# Patient Record
Sex: Male | Born: 1995 | Race: White | Hispanic: No | Marital: Single | Smoking: Never smoker
Health system: Southern US, Community
[De-identification: ages and names within clinical notes are randomized; demographics above are authoritative.]

## PROBLEM LIST (undated history)

## (undated) HISTORY — PX: NASAL SINUS SURGERY: SHX719

---

## 2019-03-02 ENCOUNTER — Encounter: Payer: Self-pay | Admitting: Emergency Medicine

## 2019-03-02 ENCOUNTER — Other Ambulatory Visit: Payer: Self-pay

## 2019-03-02 ENCOUNTER — Ambulatory Visit
Admission: EM | Admit: 2019-03-02 | Discharge: 2019-03-02 | Disposition: A | Discharge status: Home / Self Care | Payer: Self-pay | Attending: Family Medicine | Admitting: Family Medicine

## 2019-03-02 DIAGNOSIS — J029 Acute pharyngitis, unspecified: Secondary | ICD-10-CM

## 2019-03-02 DIAGNOSIS — J069 Acute upper respiratory infection, unspecified: Secondary | ICD-10-CM

## 2019-03-02 LAB — RAPID STREP SCREEN (MED CTR MEBANE ONLY): Streptococcus, Group A Screen (Direct): NEGATIVE

## 2019-03-02 NOTE — Discharge Instructions (Signed)
It was very nice seeing you today in clinic. Thank you for entrusting me with your care.   Rest and Stay HYDRATED. Water and electrolyte containing beverages (Gatorade, Pedialyte) are best to prevent dehydration and electrolyte abnormalities. Warm salt water gargles and throat lozenges will help soothe your throat. May use Tylenol and/or Ibuprofen as needed for pain/fever.   You declined COVID testing today. If symptoms worsen, or if you change you mind about testing, you can return here, or go to the drive-thru testing site at [facility].   Make arrangements to follow up with your regular doctor in 1 week for re-evaluation if not improving. If your symptoms/condition worsens, please seek follow up care either here or in the ER. Please remember, our [place] providers are "right here with you" when you need [place].   Again, it was my pleasure to take care of you today. Thank you for choosing our clinic. I hope that you start to feel better quickly.   Honor Loh, MSN, APRN, FNP-C, CEN Advanced Practice Provider [facility]

## 2019-03-02 NOTE — ED Provider Notes (Signed)
[place], [place]   Name: Christian Adams DOB: Apr 19, 1995 MRN: [id number] CSN: [id number] PCP: Patient, No Pcp Per  Arrival date and time:  03/02/19 1416  Chief Complaint:  Sore Throat   NOTE: Prior to seeing the patient today, I have reviewed the triage nursing documentation and vital signs. Clinical staff has updated patient's PMH/PSHx, current medication list, and drug allergies/intolerances to ensure comprehensive history available to assist in medical decision making.   History:   HPI: Christian Adams is a 23 y.o. male who presents today with complaints of sore throat and sinus congestion that began 2 days ago. He advises that his throat doesn't really hurt as much as is there is a "discomfort", which is worse in the morning because he tends to sleep with his mouth open causing his mouth to be dry. Pain rated 1/10. Patient states, "I am almost sure that I have strep throat though". Patient denies fevers, cough, and shortness of breath. He attributes his congestion to his normal seasonal allergies for which he takes fexofenadine.  He denies that he has experienced any nausea, vomiting, diarrhea, or abdominal pain. He is eating and drinking well. Patient denies any perceived alterations to his sense of taste or smell. Patient denies being in close contact with anyone known to be ill; no one else is his home has experienced a similar constellation. He has never been tested for SARS-CoV-2 (novel coronavirus) in the past per his report and is refusing testing today. Patient states, "I think that I had COVID earlier in the year. It kicked my butt. I know that is what I had". Patient has not been vaccinated for influenza as of yet this season. Despite his symptoms, patient has not taken any over the counter interventions to help improve/relieve his reported symptoms at home.    History reviewed. No pertinent past medical history.  Past Surgical History:  Procedure Laterality Date  . NASAL SINUS SURGERY       Family History  Problem Relation Age of Onset  . Healthy Mother   . Healthy Father     Social History   Tobacco Use  . Smoking status: Never Smoker  . Smokeless tobacco: Never Used  Substance Use Topics  . Alcohol use: Yes  . Drug use: Not Currently    There are no active problems to display for this patient.   Home Medications:    Current Meds  Medication Sig  . fexofenadine (ALLEGRA) 180 MG tablet Take 180 mg by mouth daily.    Allergies:   Patient has no known allergies.  Review of Systems (ROS): Review of Systems  Constitutional: Negative for fatigue and fever.  HENT: Positive for congestion, postnasal drip and sore throat. Negative for ear pain, rhinorrhea, sinus pressure, sinus pain and sneezing.   Eyes: Negative for pain, discharge and redness.  Respiratory: Negative for cough, chest tightness and shortness of breath.   Cardiovascular: Negative for chest pain and palpitations.  Gastrointestinal: Negative for abdominal pain, diarrhea, nausea and vomiting.  Musculoskeletal: Negative for arthralgias, back pain, myalgias and neck pain.  Skin: Negative for color change, pallor and rash.  Allergic/Immunologic: Positive for environmental allergies (seasonal).  Neurological: Negative for dizziness, syncope, weakness and headaches.  Hematological: Negative for adenopathy.     Vital Signs: Today's Vitals   03/02/19 1441 03/02/19 1442 03/02/19 1446 03/02/19 1517  BP:   111/89   Pulse:   69   Resp:   18   Temp:   97.8 F (36.6 C)  TempSrc:   Oral   SpO2:   98%   Weight:  214 lb (97.1 kg)    Height:  6' (1.829 m)    PainSc: 1    1     Physical Exam: Physical Exam  Constitutional: He is oriented to person, place, and time and well-developed, well-nourished, and in no distress.  HENT:  Head: Normocephalic and atraumatic.  Right Ear: Tympanic membrane normal.  Left Ear: Tympanic membrane normal.  Nose: Mucosal edema and rhinorrhea present. No sinus  tenderness.  Mouth/Throat: Uvula is midline and mucous membranes are normal. Posterior oropharyngeal erythema (+) white (purulent) PND present. No oropharyngeal exudate or posterior oropharyngeal edema.  Eyes: Pupils are equal, round, and reactive to light.  Neck: Normal range of motion. Neck supple.  Cardiovascular: Normal rate, regular rhythm, normal heart sounds and intact distal pulses.  Pulmonary/Chest: Effort normal and breath sounds normal.  Musculoskeletal: Normal range of motion.  Neurological: He is alert and oriented to person, place, and time. Gait normal.  Skin: Skin is warm and dry. No rash noted. He is not diaphoretic.  Psychiatric: Mood, memory, affect and judgment normal.  Nursing note and vitals reviewed.   Urgent Care Treatments / Results:  LABS: PLEASE NOTE: all labs that were ordered this encounter are listed, however only abnormal results are displayed. Labs Reviewed  RAPID STREP SCREEN ([facility] ONLY)  CULTURE, GROUP A STREP [facility])    EKG: -None  RADIOLOGY: No results found.  PROCEDURES: Procedures  MEDICATIONS RECEIVED THIS VISIT: Medications - No data to display  PERTINENT CLINICAL COURSE NOTES/UPDATES:   Initial Impression / Assessment and Plan / Urgent Care Course:  Pertinent labs & imaging results that were available during my care of the patient were personally reviewed by me and considered in my medical decision making (see lab/imaging section of note for values and interpretations).  Christian Adams is a 23 y.o. male who presents to [facility] today with complaints of Sore Throat   Patient overall well appearing and in no acute distress today in clinic. Presenting symptoms (see HPI) and exam as documented above. He presents with symptoms associated with SARS-CoV-2 (novel coronavirus), however he refuses the testing today citing that he feels like he had the viral infection earlier in the year. Exam reveals minor throat irritation with  evidence of thick white PND. Rapid streptococcal throat swab (-); reflex culture sent. Symptoms consistent with acute viral URI. I discussed with him that his symptoms are felt to be viral in nature, thus antibiotics would not offer him any relief or improve his symptoms any faster than conservative symptomatic management. Discussed supportive care measures at home during acute phase of illness. Patient to rest as much as possible. He was encouraged to ensure adequate hydration (water and ORS) to prevent dehydration and electrolyte derangements. Patient may use APAP and/or IBU on an as needed basis for pain/fever. Encouraged to use warm salt water gargles, hot teas, and lozenges to help soothe his throat discomfort.   Discussed follow up with primary care physician in 1 week for re-evaluation. I have reviewed the follow up and strict return precautions for any new or worsening symptoms. Patient is aware of symptoms that would be deemed urgent/emergent, and would thus require further evaluation either here or in the emergency department. At the time of discharge, he verbalized understanding and consent with the discharge plan as it was reviewed with him. All questions were fielded by provider and/or clinic staff prior to patient  discharge.    Final Clinical Impressions / Urgent Care Diagnoses:   Final diagnoses:  Viral URI    New Prescriptions:  [place] Controlled Substance Registry consulted? Not Applicable  No orders of the defined types were placed in this encounter.   Recommended Follow up Care:  Patient encouraged to follow up with the following provider within the specified time frame, or sooner as dictated by the severity of his symptoms. As always, he was instructed that for any urgent/emergent care needs, he should seek care either here or in the emergency department for more immediate evaluation.  Follow-up Information    PCP In 1 week.   Why: General reassessment of symptoms if not  improving        NOTE: This note was prepared using Scientist, clinical (histocompatibility and immunogenetics)Dragon dictation software along with smaller Lobbyistphrase technology. Despite my best ability to proofread, there is the potential that transcriptional errors may still occur from this process, and are completely unintentional.    Verlee MonteGray, Wilfred Dayrit E, NP 03/03/19 0022

## 2019-03-02 NOTE — ED Triage Notes (Signed)
Pt c/o sore throat. Started about 2 days ago. Denies fever, headache, nausea. He later states that he has sinus congestion and "typical allergies" pt is self pay and declines covid testing until strep test comes back.

## 2019-03-05 LAB — CULTURE, GROUP A STREP (THRC)

## 2019-05-01 ENCOUNTER — Other Ambulatory Visit: Payer: Self-pay

## 2019-05-01 ENCOUNTER — Emergency Department: Payer: BC Managed Care – PPO

## 2019-05-01 DIAGNOSIS — R0789 Other chest pain: Secondary | ICD-10-CM | POA: Diagnosis present

## 2019-05-01 LAB — CBC
HCT: 44.7 % (ref 39.0–52.0)
Hemoglobin: 15.1 g/dL (ref 13.0–17.0)
MCH: 29.2 pg (ref 26.0–34.0)
MCHC: 33.8 g/dL (ref 30.0–36.0)
MCV: 86.3 fL (ref 80.0–100.0)
Platelets: 375 10*3/uL (ref 150–400)
RBC: 5.18 MIL/uL (ref 4.22–5.81)
RDW: 12.3 % (ref 11.5–15.5)
WBC: 11 10*3/uL — ABNORMAL HIGH (ref 4.0–10.5)
nRBC: 0 % (ref 0.0–0.2)

## 2019-05-01 NOTE — ED Triage Notes (Signed)
Patient reports left sided chest pain that started around 5 pm worse with movement.

## 2019-05-02 ENCOUNTER — Emergency Department
Admission: EM | Admit: 2019-05-02 | Discharge: 2019-05-02 | Disposition: A | Discharge status: Home / Self Care | Payer: BC Managed Care – PPO | Attending: Student in an Organized Health Care Education/Training Program | Admitting: Student in an Organized Health Care Education/Training Program

## 2019-05-02 DIAGNOSIS — R0789 Other chest pain: Secondary | ICD-10-CM

## 2019-05-02 LAB — BASIC METABOLIC PANEL
Anion gap: 8 (ref 5–15)
BUN: 14 mg/dL (ref 6–20)
CO2: 27 mmol/L (ref 22–32)
Calcium: 9.3 mg/dL (ref 8.9–10.3)
Chloride: 104 mmol/L (ref 98–111)
Creatinine, Ser: 1.13 mg/dL (ref 0.61–1.24)
GFR calc Af Amer: >60 mL/min (ref >60)
GFR calc non Af Amer: >60 mL/min (ref >60)
Glucose, Bld: 98 mg/dL (ref 70–99)
Potassium: 4 mmol/L (ref 3.5–5.1)
Sodium: 139 mmol/L (ref 135–145)

## 2019-05-02 LAB — TROPONIN I (HIGH SENSITIVITY): Troponin I (High Sensitivity): <2 ng/L (ref <18)

## 2019-05-02 NOTE — ED Provider Notes (Signed)
[facility] Emergency Department Provider Note    First MD Initiated Contact with Patient 05/02/19 0114     (approximate)  I have reviewed the triage vital signs and the nursing notes.   HISTORY  Chief Complaint Chest Pain    HPI Christian Adams is a 24 y.o. male previously healthy presents to the ER for chest discomfort.  This discomfort started while he was at work.  Felt like he was under increased stress at work.  States has had similar symptoms over the past 3 years.  Feels like he can feel his heart thumping and has some pain radiating up into his jaw.  Denies any shortness of breath diaphoresis pain radiating through to his back.  Symptoms last up to an over an hour.  Denies any pain with deep inspiration right now.  Does not smoke.  Drinks occasionally.  States he also sometimes feels an irregular heartbeat.  Has not sought medical care for this yet.    No past medical history on file. Family History  Problem Relation Age of Onset  . Healthy Mother   . Healthy Father    Past Surgical History:  Procedure Laterality Date  . NASAL SINUS SURGERY     There are no problems to display for this patient.     Prior to Admission medications   Medication Sig Start Date End Date Taking? Authorizing Provider  fexofenadine (ALLEGRA) 180 MG tablet Take 180 mg by mouth daily.    [provider]    Allergies Patient has no known allergies.    Social History Social History   Tobacco Use  . Smoking status: Never Smoker  . Smokeless tobacco: Never Used  Substance Use Topics  . Alcohol use: Yes  . Drug use: Not Currently    Review of Systems Patient denies headaches, rhinorrhea, blurry vision, numbness, shortness of breath, chest pain, edema, cough, abdominal pain, nausea, vomiting, diarrhea, dysuria, fevers, rashes or hallucinations unless otherwise stated above in HPI. ____________________________________________   PHYSICAL  EXAM:  VITAL SIGNS: Vitals:   05/01/19 2344 05/02/19 0133  BP: 139/72 (!) 144/74  Pulse: 63 60  Resp: 16 16  Temp: 98.6 F (37 C)   SpO2: 100% 100%    Constitutional: Alert and oriented.  Eyes: Conjunctivae are normal.  Head: Atraumatic. Nose: No congestion/rhinnorhea. Mouth/Throat: Mucous membranes are moist.   Neck: No stridor. Painless ROM.  Cardiovascular: Normal rate, regular rhythm. Grossly normal heart sounds.  Good peripheral circulation. Respiratory: Normal respiratory effort.  No retractions. Lungs CTAB. Gastrointestinal: Soft and nontender. No distention. No abdominal bruits. No CVA tenderness. Genitourinary:  Musculoskeletal: No lower extremity tenderness nor edema.  No joint effusions. Neurologic:  Normal speech and language. No gross focal neurologic deficits are appreciated. No facial droop Skin:  Skin is warm, dry and intact. No rash noted. Psychiatric: Mood and affect are normal. Speech and behavior are normal.  ____________________________________________   LABS (all labs ordered are listed, but only abnormal results are displayed)  Results for orders placed or performed during the hospital encounter of 05/02/19 (from the past 24 hour(s))  Basic metabolic panel     Status: None   Collection Time: 05/01/19 11:48 PM  Result Value Ref Range   Sodium 139 135 - 145 mmol/L   Potassium 4.0 3.5 - 5.1 mmol/L   Chloride 104 98 - 111 mmol/L   CO2 27 22 - 32 mmol/L   Glucose, Bld 98 70 - 99 mg/dL   BUN  14 6 - 20 mg/dL   Creatinine, Ser 1.13 0.61 - 1.24 mg/dL   Calcium 9.3 8.9 - 10.3 mg/dL   GFR calc non Af Amer >60 >60 mL/min   GFR calc Af Amer >60 >60 mL/min   Anion gap 8 5 - 15  CBC     Status: Abnormal   Collection Time: 05/01/19 11:48 PM  Result Value Ref Range   WBC 11.0 (H) 4.0 - 10.5 K/uL   RBC 5.18 4.22 - 5.81 MIL/uL   Hemoglobin 15.1 13.0 - 17.0 g/dL   HCT 44.7 39.0 - 52.0 %   MCV 86.3 80.0 - 100.0 fL   MCH 29.2 26.0 - 34.0 pg   MCHC 33.8 30.0 -  36.0 g/dL   RDW 12.3 11.5 - 15.5 %   Platelets 375 150 - 400 K/uL   nRBC 0.0 0.0 - 0.2 %  Troponin I (High Sensitivity)     Status: None   Collection Time: 05/01/19 11:48 PM  Result Value Ref Range   Troponin I (High Sensitivity) <2 <18 ng/L   ____________________________________________  EKG My review and personal interpretation at Time: 23:42   Indication: chest pain  Rate: 60  Rhythm: sinus Axis: normal Other: normal intervals, norm st and t wave segments ____________________________________________  RADIOLOGY  I personally reviewed all radiographic images ordered to evaluate for the above acute complaints and reviewed radiology reports and findings.  These findings were personally discussed with the patient.  Please see medical record for radiology report.  ____________________________________________   PROCEDURES  Procedure(s) performed:  Procedures    Critical Care performed: no ____________________________________________   INITIAL IMPRESSION / ASSESSMENT AND PLAN / ED COURSE  Pertinent labs & imaging results that were available during my care of the patient were reviewed by me and considered in my medical decision making (see chart for details).   DDX: ACS, pericarditis, esophagitis, boerhaaves, pe, dissection, pna, bronchitis, costochondritis   Christian Adams is a 24 y.o. who presents to the ED with symptoms as described above.  Patient well-appearing and in no acute distress.  Is low risk by Wells criteria and PERC negative.  Low risk heart score.  Abdominal exam is soft and benign.  Does not seem consistent with dissection, Boerhaave's or mediastinitis.  No evidence of pneumothorax.  Not c/w pericarditis.  Patient cleared for outpatient follow-up.  Have discussed with the patient and available family all diagnostics and treatments performed thus far and all questions were answered to the best of my ability. The patient demonstrates understanding and agreement with  plan.      The patient was evaluated in Emergency Department today for the symptoms described in the history of present illness. He/she was evaluated in the context of the global COVID-19 pandemic, which necessitated consideration that the patient might be at risk for infection with the SARS-CoV-2 virus that causes COVID-19. Institutional protocols and algorithms that pertain to the evaluation of patients at risk for COVID-19 are in a state of rapid change based on information released by regulatory bodies including the CDC and federal and state organizations. These policies and algorithms were followed during the patient's care in the ED.  As part of my medical decision making, I reviewed the following data within the [place] notes reviewed and incorporated, Labs reviewed, notes from prior ED visits and [place] Controlled Substance Database   ____________________________________________   FINAL CLINICAL IMPRESSION(S) / ED DIAGNOSES  Final diagnoses:  Atypical chest pain  NEW MEDICATIONS STARTED DURING THIS VISIT:  New Prescriptions   No medications on file     Note:  This document was prepared using Dragon voice recognition software and may include unintentional dictation errors.    Willy Eddy, MD 05/02/19 [phone]

## 2020-10-05 ENCOUNTER — Ambulatory Visit
Admission: EM | Admit: 2020-10-05 | Discharge: 2020-10-05 | Disposition: A | Discharge status: Home / Self Care | Payer: BC Managed Care – PPO

## 2020-10-05 ENCOUNTER — Other Ambulatory Visit: Payer: Self-pay

## 2020-10-05 ENCOUNTER — Encounter: Payer: Self-pay | Admitting: Emergency Medicine

## 2020-10-05 DIAGNOSIS — T63444A Toxic effect of venom of bees, undetermined, initial encounter: Secondary | ICD-10-CM

## 2020-10-05 MED ORDER — PREDNISONE 20 MG PO TABS: ORAL_TABLET | ORAL | 0 refills | Status: AC

## 2020-10-05 NOTE — ED Triage Notes (Signed)
Pt states he was stung by a yellow jackt about 20 minutes ago on the back of his right thigh. Pt states he was allergic as a kid but has not gotten stung in 10 years or more. Area is red, raised and itchy.

## 2020-10-05 NOTE — Discharge Instructions (Addendum)
Get Claritin ( loratidine) and take one a day for 7 days Ice area for 20 minutes every 2 hours today, then 3-4 times day You may start the prednisone right away, but it can cause you insomnia if you take it at bed time.

## 2021-07-23 IMAGING — CR DG CHEST 2V
1 series · 2 of 2 positions shown · non-contrast
Comparison: None.

CLINICAL DATA: Chest pain

EXAM:
CHEST - 2 VIEW

[Series 1: dg chest 2 view · 0.14mm/px · 2 of 2 slices shown]
[im 1/2]
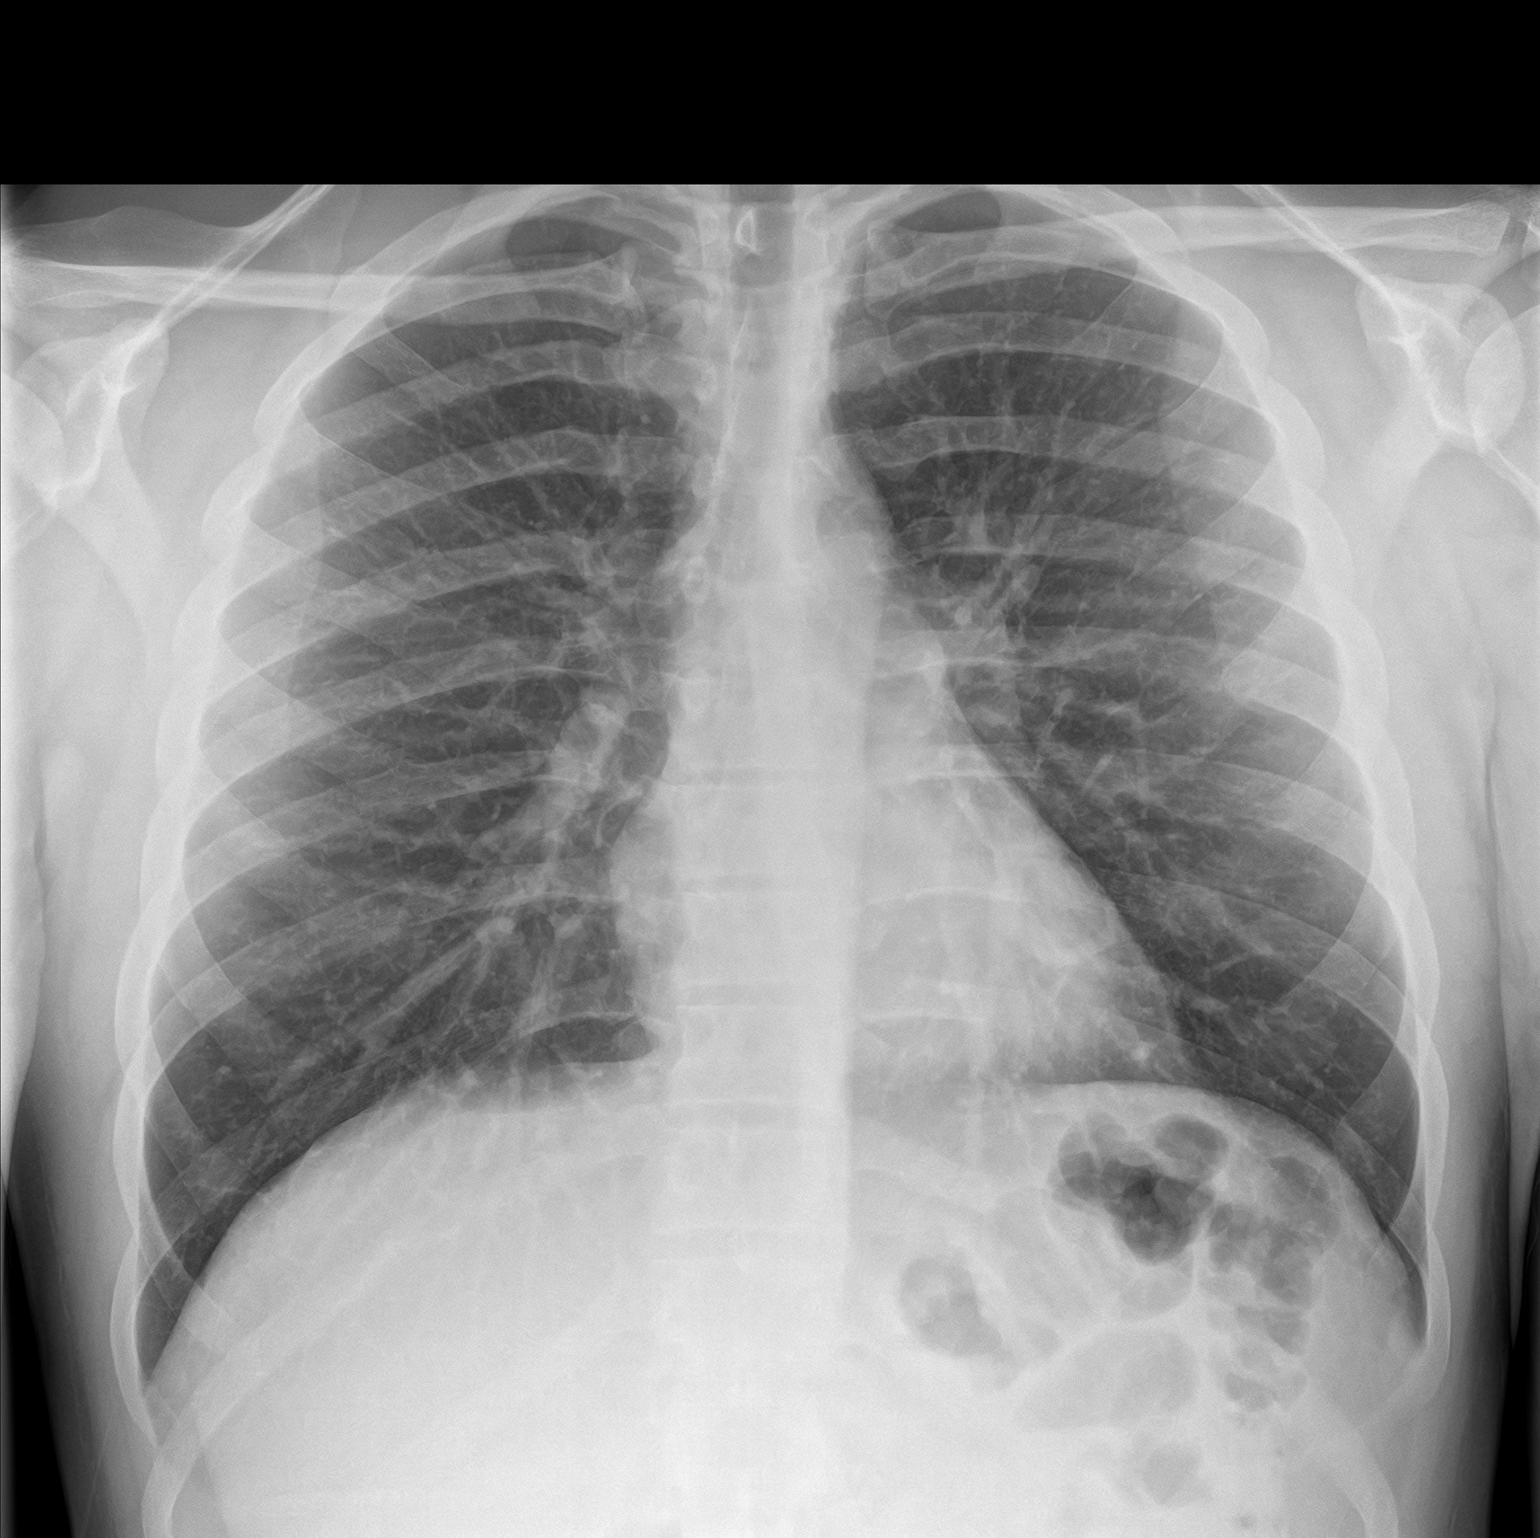
[im 2/2]
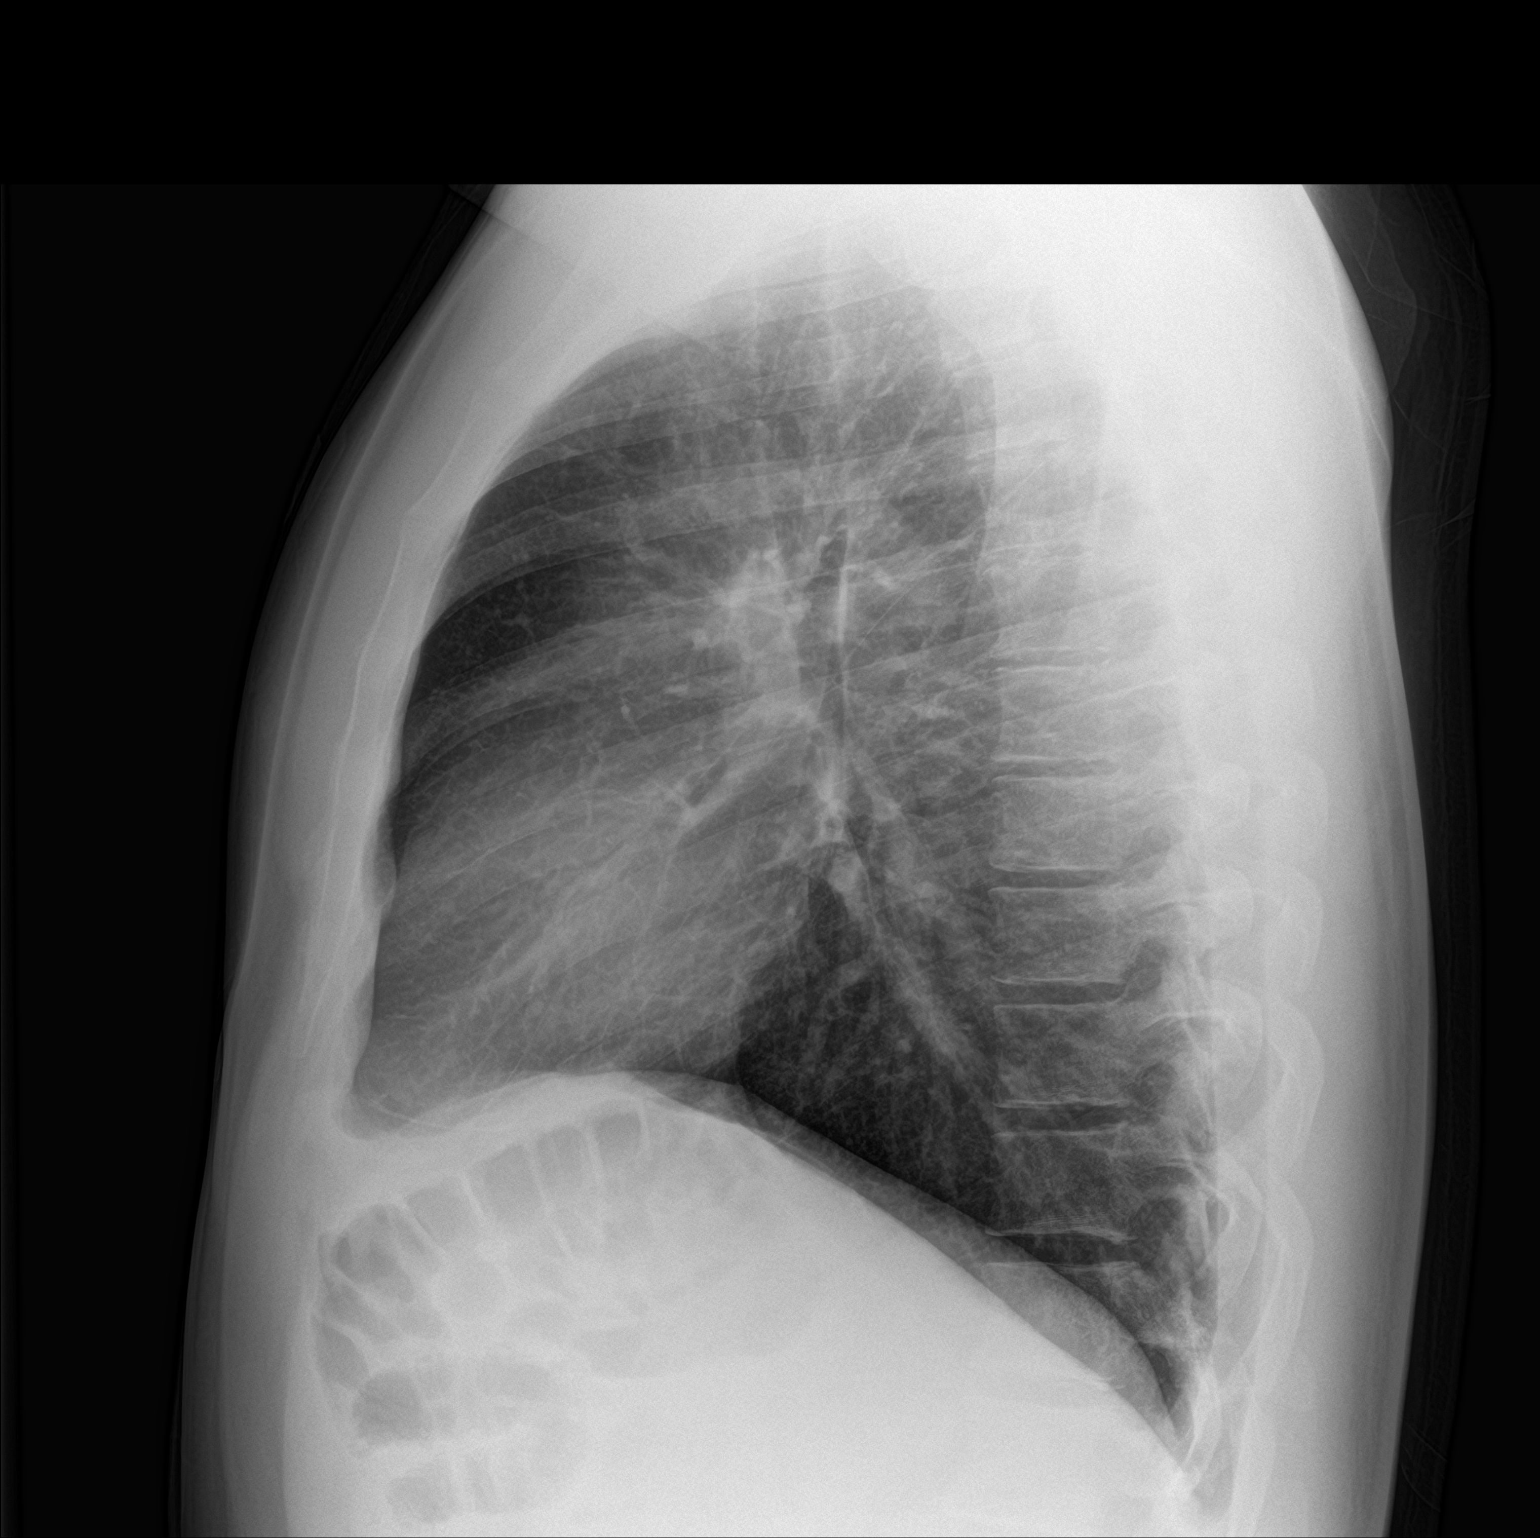

[2 of 2 positions shown; findings below may reference images not displayed]

FINDINGS: The heart size and mediastinal contours are within normal limits.
Both lungs are clear. The visualized skeletal structures are
unremarkable.
IMPRESSION: No active cardiopulmonary disease.
